# Patient Record
Sex: Male | Born: 1995 | Race: White | Hispanic: No | Marital: Single | Smoking: Never smoker
Health system: Southern US, Community
[De-identification: ages and names within clinical notes are randomized; demographics above are authoritative.]

## PROBLEM LIST (undated history)

## (undated) DIAGNOSIS — S99911A Unspecified injury of right ankle, initial encounter: Secondary | ICD-10-CM

## (undated) DIAGNOSIS — S3991XA Unspecified injury of abdomen, initial encounter: Secondary | ICD-10-CM

## (undated) DIAGNOSIS — M9260 Juvenile osteochondrosis of tarsus, unspecified ankle: Secondary | ICD-10-CM

## (undated) DIAGNOSIS — M7660 Achilles tendinitis, unspecified leg: Secondary | ICD-10-CM

## (undated) HISTORY — DX: Juvenile osteochondrosis of tarsus, unspecified ankle: M92.60

## (undated) HISTORY — DX: Unspecified injury of abdomen, initial encounter: S39.91XA

## (undated) HISTORY — DX: Achilles tendinitis, unspecified leg: M76.60

## (undated) HISTORY — DX: Unspecified injury of right ankle, initial encounter: S99.911A

---
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
## 2015-03-18 ENCOUNTER — Ambulatory Visit (INDEPENDENT_AMBULATORY_CARE_PROVIDER_SITE_OTHER): Payer: 59 | Admitting: Pediatrics

## 2015-03-18 VITALS — Wt 163.0 lb

## 2015-03-18 DIAGNOSIS — R52 Pain, unspecified: Secondary | ICD-10-CM

## 2015-03-18 DIAGNOSIS — J039 Acute tonsillitis, unspecified: Secondary | ICD-10-CM | POA: Diagnosis not present

## 2015-03-18 MED ORDER — CEFDINIR 300 MG PO CAPS: 300.0000 mg | ORAL_CAPSULE | Freq: Two times a day (BID) | ORAL | Status: AC

## 2015-03-18 NOTE — Patient Instructions (Signed)

## 2015-03-19 ENCOUNTER — Encounter: Payer: Self-pay | Admitting: Pediatrics

## 2015-03-19 DIAGNOSIS — J039 Acute tonsillitis, unspecified: Secondary | ICD-10-CM | POA: Insufficient documentation

## 2015-03-19 DIAGNOSIS — R52 Pain, unspecified: Secondary | ICD-10-CM | POA: Insufficient documentation

## 2015-03-19 LAB — POCT RAPID STREP A (OFFICE): RAPID STREP A SCREEN: NEGATIVE

## 2015-03-19 LAB — POCT INFLUENZA B: RAPID INFLUENZA B AGN: NEGATIVE

## 2015-03-19 LAB — POCT INFLUENZA A: Rapid Influenza A Ag: NEGATIVE

## 2015-03-19 NOTE — Progress Notes (Signed)
Subjective:     Manuel Hopkins is a 20 y.o. male who presents for evaluation of fever, body aches, nasal congestion and sore throat. Associated symptoms include dry cough, enlarged tonsils, headache, nasal blockage, post nasal drip, sinus and nasal congestion, sore throat and white spots in throat. Onset of symptoms was 3 days ago, and have been gradually worsening since that time. He is drinking plenty of fluids. He has not had a recent close exposure to someone with proven streptococcal pharyngitis.  The following portions of the patient's history were reviewed and updated as appropriate: allergies, current medications, past family history, past medical history, past social history, past surgical history and problem list.  Review of Systems Pertinent items are noted in HPI.    Objective:    Wt 163 lb (73.936 kg) General:  alert and cooperative  Mouth:  abnormal findings: exudates present and marked oropharyngeal erythema  Neck: no adenopathy, supple, symmetrical, trachea midline and thyroid not enlarged, symmetric, no tenderness/mass/nodules.   Laboratory Strep test done. Results:negative    Flu A and B negative   Assessment:     Acute Pharyngitis, likely  Bacterial tonsillitis R/O flu    Plan:    Use of OTC analgesics recommended as well as salt water gargles. Use of decongestant recommended. Patient advised of the risk of peritonsillar abscess formation.   Antibiotics as ordered

## 2015-03-19 NOTE — Addendum Note (Signed)
Addended by: Georgiann HahnAMGOOLAM, Roberts Bon on: 03/19/2015 12:36 AM   Modules accepted: Orders

## 2015-06-06 ENCOUNTER — Other Ambulatory Visit: Payer: Self-pay | Admitting: Pediatrics

## 2015-06-06 ENCOUNTER — Telehealth: Payer: Self-pay | Admitting: Pediatrics

## 2015-06-06 MED ORDER — CEFDINIR 300 MG PO CAPS: 300.0000 mg | ORAL_CAPSULE | Freq: Two times a day (BID) | ORAL | Status: AC

## 2015-06-06 NOTE — Telephone Encounter (Signed)
Manuel Hopkins has had sore throat and nasal congestion for approximately 10 days. He has developed a sever sinus headache and pressure in his forehead. No fevers. He has been taking Claritin and Benadryl with no improvement. Symptoms have worsened over the past few days. Will treat with cefdinir for acute sinusitis. Antibiotic sent to preferred pharmacy.

## 2015-06-30 ENCOUNTER — Other Ambulatory Visit: Payer: Self-pay | Admitting: Pediatrics

## 2015-06-30 MED ORDER — HYDROXYZINE HCL 25 MG PO TABS: 25.0000 mg | ORAL_TABLET | Freq: Three times a day (TID) | ORAL | Status: AC | PRN

## 2015-06-30 MED ORDER — FLUTICASONE PROPIONATE 50 MCG/ACT NA SUSP: 1.0000 | Freq: Every day | NASAL | Status: AC

## 2015-10-23 ENCOUNTER — Other Ambulatory Visit: Payer: Self-pay | Admitting: Pediatrics

## 2016-01-20 ENCOUNTER — Telehealth: Payer: 59 | Admitting: Family

## 2016-01-20 DIAGNOSIS — A084 Viral intestinal infection, unspecified: Secondary | ICD-10-CM

## 2016-01-20 MED ORDER — ONDANSETRON HCL 4 MG PO TABS: 4.0000 mg | ORAL_TABLET | Freq: Three times a day (TID) | ORAL | 0 refills | Status: AC | PRN

## 2016-01-20 NOTE — Progress Notes (Signed)
We are sorry that you are not feeling well. Here is how we plan to help!  Based on what you have shared with me it looks like you have a Virus that is irritating your GI tract.  Vomiting is the forceful emptying of a portion of the stomach's content through the mouth.  Although nausea and vomiting can make you feel miserable, it's important to remember that these are not diseases, but rather symptoms of an underlying illness.  When we treat short term symptoms, we always caution that any symptoms that persist should be fully evaluated in a medical office.  I have prescribed a medication that will help alleviate your symptoms and allow you to stay hydrated:  Zofran 4 mg 1 tablet every 8 hours as needed for nausea and vomiting   I have also written you a note for school. Hope you feel better soon!  HOME CARE:  Drink clear liquids.  This is very important! Dehydration (the lack of fluid) can lead to a serious complication.  Start off with 1 tablespoon every 5 minutes for 8 hours.  You may begin eating bland foods after 8 hours without vomiting.  Start with saltine crackers, white bread, rice, mashed potatoes, applesauce.  After 48 hours on a bland diet, you may resume a normal diet.  Try to go to sleep.  Sleep often empties the stomach and relieves the need to vomit.  GET HELP RIGHT AWAY IF:   Your symptoms do not improve or worsen within 2 days after treatment.  You have a fever for over 3 days.  You cannot keep down fluids after trying the medication.  MAKE SURE YOU:   Understand these instructions.  Will watch your condition.  Will get help right away if you are not doing well or get worse.   Thank you for choosing an e-visit. Your e-visit answers were reviewed by a board certified advanced clinical practitioner to complete your personal care plan. Depending upon the condition, your plan could have included both over the counter or prescription medications. Please review your  pharmacy choice. Be sure that the pharmacy you have chosen is open so that you can pick up your prescription now.  If there is a problem you may message your provider in MyChart to have the prescription routed to another pharmacy. Your safety is important to us. If you have drug allergies check your prescription carefully.  For the next 24 hours, you can use MyChart to ask questions about today's visit, request a non-urgent call back, or ask for a work or school excuse from your e-visit provider. You will get an e-mail in the next two days asking about your experience. I hope that your e-visit has been valuable and will speed your recovery.

## 2016-04-08 ENCOUNTER — Other Ambulatory Visit: Payer: Self-pay | Admitting: Pediatrics

## 2016-04-08 MED ORDER — OSELTAMIVIR PHOSPHATE 75 MG PO CAPS: 75.0000 mg | ORAL_CAPSULE | Freq: Two times a day (BID) | ORAL | 0 refills | Status: AC

## 2017-04-19 DIAGNOSIS — L700 Acne vulgaris: Secondary | ICD-10-CM | POA: Diagnosis not present

## 2017-07-19 DIAGNOSIS — L700 Acne vulgaris: Secondary | ICD-10-CM | POA: Diagnosis not present

## 2017-09-26 ENCOUNTER — Ambulatory Visit: Payer: 59 | Admitting: Pediatrics

## 2017-12-04 DIAGNOSIS — L700 Acne vulgaris: Secondary | ICD-10-CM | POA: Diagnosis not present

## 2018-11-21 DIAGNOSIS — Z1159 Encounter for screening for other viral diseases: Secondary | ICD-10-CM | POA: Diagnosis not present

## 2018-11-21 DIAGNOSIS — Z6824 Body mass index (BMI) 24.0-24.9, adult: Secondary | ICD-10-CM | POA: Diagnosis not present

## 2018-11-21 DIAGNOSIS — Z20828 Contact with and (suspected) exposure to other viral communicable diseases: Secondary | ICD-10-CM | POA: Diagnosis not present

## 2019-01-28 DIAGNOSIS — L508 Other urticaria: Secondary | ICD-10-CM | POA: Diagnosis not present

## 2019-02-01 DIAGNOSIS — L700 Acne vulgaris: Secondary | ICD-10-CM | POA: Diagnosis not present

## 2019-06-12 DIAGNOSIS — S335XXA Sprain of ligaments of lumbar spine, initial encounter: Secondary | ICD-10-CM | POA: Diagnosis not present

## 2019-06-19 DIAGNOSIS — R05 Cough: Secondary | ICD-10-CM | POA: Diagnosis not present

## 2019-06-19 DIAGNOSIS — R070 Pain in throat: Secondary | ICD-10-CM | POA: Diagnosis not present

## 2019-06-19 DIAGNOSIS — U071 COVID-19: Secondary | ICD-10-CM | POA: Diagnosis not present

## 2019-11-19 DIAGNOSIS — Z20822 Contact with and (suspected) exposure to covid-19: Secondary | ICD-10-CM | POA: Diagnosis not present

## 2019-11-19 DIAGNOSIS — J309 Allergic rhinitis, unspecified: Secondary | ICD-10-CM | POA: Diagnosis not present

## 2020-02-26 DIAGNOSIS — Z20822 Contact with and (suspected) exposure to covid-19: Secondary | ICD-10-CM | POA: Diagnosis not present

## 2020-03-05 DIAGNOSIS — Z20822 Contact with and (suspected) exposure to covid-19: Secondary | ICD-10-CM | POA: Diagnosis not present

## 2020-09-16 DIAGNOSIS — J029 Acute pharyngitis, unspecified: Secondary | ICD-10-CM | POA: Diagnosis not present

## 2021-02-10 DIAGNOSIS — J069 Acute upper respiratory infection, unspecified: Secondary | ICD-10-CM | POA: Diagnosis not present

## 2021-07-05 ENCOUNTER — Other Ambulatory Visit: Payer: Self-pay | Admitting: Pediatrics

## 2021-07-05 MED ORDER — DAPSONE 5 % EX GEL: 1.0000 "application " | Freq: Two times a day (BID) | CUTANEOUS | 12 refills | Status: DC

## 2022-07-14 ENCOUNTER — Other Ambulatory Visit: Payer: Self-pay | Admitting: Pediatrics

## 2022-07-14 MED ORDER — DAPSONE 5 % EX GEL: 1.0000 | Freq: Two times a day (BID) | CUTANEOUS | 12 refills | Status: DC

## 2022-07-14 NOTE — Progress Notes (Signed)
Refilled acne medications

## 2022-07-22 ENCOUNTER — Other Ambulatory Visit: Payer: Self-pay | Admitting: Pediatrics

## 2022-07-22 MED ORDER — CLINDAMYCIN PHOS-BENZOYL PEROX 1.2-5 % EX GEL
1.0000 | Freq: Two times a day (BID) | CUTANEOUS | 12 refills | Status: AC
Start: 2022-07-22 — End: 2022-08-21

## 2023-04-24 ENCOUNTER — Other Ambulatory Visit: Payer: Self-pay | Admitting: Pediatrics

## 2023-04-24 MED ORDER — DAPSONE 5 % EX GEL: 1.0000 | Freq: Two times a day (BID) | CUTANEOUS | 12 refills | Status: DC
